# Patient Record
Sex: Female | Born: 1985 | Hispanic: No | Marital: Single | Smoking: Never smoker
Health system: Southern US, Community
[De-identification: ages and names within clinical notes are randomized; demographics above are authoritative.]

## PROBLEM LIST (undated history)

## (undated) ENCOUNTER — Ambulatory Visit (HOSPITAL_COMMUNITY): Discharge status: Absent without leave | Payer: Medicaid Other

---
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
## 2005-01-23 ENCOUNTER — Encounter: Admission: RE | Admit: 2005-01-23 | Discharge: 2005-01-23 | Payer: Self-pay | Admitting: Cardiology

## 2005-02-07 ENCOUNTER — Ambulatory Visit (HOSPITAL_COMMUNITY): Admission: RE | Admit: 2005-02-07 | Discharge: 2005-02-07 | Payer: Self-pay | Admitting: Cardiology

## 2005-05-28 ENCOUNTER — Other Ambulatory Visit: Admission: RE | Admit: 2005-05-28 | Discharge: 2005-05-28 | Payer: Self-pay | Admitting: Obstetrics and Gynecology

## 2005-06-06 ENCOUNTER — Emergency Department (HOSPITAL_COMMUNITY): Admission: EM | Admit: 2005-06-06 | Discharge: 2005-06-06 | Payer: Self-pay | Admitting: Emergency Medicine

## 2005-06-08 ENCOUNTER — Emergency Department (HOSPITAL_COMMUNITY): Admission: EM | Admit: 2005-06-08 | Discharge: 2005-06-08 | Payer: Self-pay | Admitting: Emergency Medicine

## 2005-07-25 ENCOUNTER — Emergency Department (HOSPITAL_COMMUNITY): Admission: EM | Admit: 2005-07-25 | Discharge: 2005-07-25 | Payer: Self-pay | Admitting: Family Medicine

## 2005-10-10 ENCOUNTER — Emergency Department (HOSPITAL_COMMUNITY): Admission: EM | Admit: 2005-10-10 | Discharge: 2005-10-10 | Payer: Self-pay | Admitting: Family Medicine

## 2006-03-04 ENCOUNTER — Other Ambulatory Visit: Admission: RE | Admit: 2006-03-04 | Discharge: 2006-03-04 | Payer: Self-pay | Admitting: Obstetrics and Gynecology

## 2006-03-19 ENCOUNTER — Emergency Department (HOSPITAL_COMMUNITY): Admission: EM | Admit: 2006-03-19 | Discharge: 2006-03-19 | Payer: Self-pay | Admitting: Family Medicine

## 2006-10-10 ENCOUNTER — Emergency Department (HOSPITAL_COMMUNITY): Admission: EM | Admit: 2006-10-10 | Discharge: 2006-10-10 | Payer: Self-pay | Admitting: Emergency Medicine

## 2007-05-06 ENCOUNTER — Emergency Department (HOSPITAL_COMMUNITY): Admission: EM | Admit: 2007-05-06 | Discharge: 2007-05-06 | Payer: Self-pay | Admitting: Emergency Medicine

## 2007-08-03 ENCOUNTER — Emergency Department (HOSPITAL_COMMUNITY): Admission: EM | Admit: 2007-08-03 | Discharge: 2007-08-03 | Payer: Self-pay | Admitting: Emergency Medicine

## 2008-01-16 ENCOUNTER — Emergency Department (HOSPITAL_COMMUNITY): Admission: EM | Admit: 2008-01-16 | Discharge: 2008-01-16 | Payer: Self-pay | Admitting: Emergency Medicine

## 2008-10-22 ENCOUNTER — Emergency Department (HOSPITAL_COMMUNITY): Admission: EM | Admit: 2008-10-22 | Discharge: 2008-10-22 | Payer: Self-pay | Admitting: Emergency Medicine

## 2008-11-06 ENCOUNTER — Emergency Department (HOSPITAL_COMMUNITY): Admission: EM | Admit: 2008-11-06 | Discharge: 2008-11-06 | Payer: Self-pay | Admitting: Family Medicine

## 2009-03-01 ENCOUNTER — Inpatient Hospital Stay (HOSPITAL_COMMUNITY): Admission: AD | Admit: 2009-03-01 | Discharge: 2009-03-01 | Payer: Self-pay | Admitting: Obstetrics & Gynecology

## 2009-03-01 ENCOUNTER — Ambulatory Visit: Payer: Self-pay | Admitting: Obstetrics and Gynecology

## 2009-03-10 ENCOUNTER — Inpatient Hospital Stay (HOSPITAL_COMMUNITY): Admission: RE | Admit: 2009-03-10 | Discharge: 2009-03-10 | Payer: Self-pay | Admitting: Obstetrics & Gynecology

## 2010-01-02 ENCOUNTER — Emergency Department (HOSPITAL_COMMUNITY): Admission: EM | Admit: 2010-01-02 | Discharge: 2010-01-02 | Payer: Self-pay | Admitting: Family Medicine

## 2010-01-13 ENCOUNTER — Inpatient Hospital Stay (HOSPITAL_COMMUNITY): Admission: AD | Admit: 2010-01-13 | Discharge: 2010-01-13 | Payer: Self-pay | Admitting: Obstetrics & Gynecology

## 2010-03-30 ENCOUNTER — Emergency Department (HOSPITAL_COMMUNITY): Admission: EM | Admit: 2010-03-30 | Discharge: 2010-03-30 | Payer: Self-pay | Admitting: Family Medicine

## 2010-04-09 ENCOUNTER — Inpatient Hospital Stay (HOSPITAL_COMMUNITY): Admission: AD | Admit: 2010-04-09 | Discharge: 2010-04-09 | Payer: Self-pay | Admitting: Obstetrics and Gynecology

## 2010-04-09 ENCOUNTER — Ambulatory Visit: Payer: Self-pay | Admitting: Advanced Practice Midwife

## 2010-09-13 ENCOUNTER — Inpatient Hospital Stay (HOSPITAL_COMMUNITY)
Admission: AD | Admit: 2010-09-13 | Discharge: 2010-09-15 | Payer: Self-pay | Source: Home / Self Care | Attending: Obstetrics and Gynecology | Admitting: Obstetrics and Gynecology

## 2010-12-10 LAB — CBC
HCT: 33.3 % — ABNORMAL LOW (ref 36.0–46.0)
MCHC: 34.5 g/dL (ref 30.0–36.0)
MCV: 87.3 fL (ref 78.0–100.0)
Platelets: 254 10*3/uL (ref 150–400)
Platelets: 284 10*3/uL (ref 150–400)
RDW: 14 % (ref 11.5–15.5)
RDW: 14.1 % (ref 11.5–15.5)
WBC: 10.5 10*3/uL (ref 4.0–10.5)
WBC: 17.2 10*3/uL — ABNORMAL HIGH (ref 4.0–10.5)

## 2010-12-16 LAB — POCT RAPID STREP A (OFFICE): Streptococcus, Group A Screen (Direct): NEGATIVE

## 2010-12-18 LAB — URINALYSIS, ROUTINE W REFLEX MICROSCOPIC
Bilirubin Urine: NEGATIVE
Nitrite: NEGATIVE
Protein, ur: NEGATIVE mg/dL
Urobilinogen, UA: 0.2 mg/dL (ref 0.0–1.0)

## 2010-12-18 LAB — CBC
HCT: 37.4 % (ref 36.0–46.0)
MCV: 92.8 fL (ref 78.0–100.0)
RBC: 4.03 MIL/uL (ref 3.87–5.11)
WBC: 9 10*3/uL (ref 4.0–10.5)

## 2010-12-18 LAB — GC/CHLAMYDIA PROBE AMP, GENITAL: Chlamydia, DNA Probe: NEGATIVE

## 2010-12-18 LAB — WET PREP, GENITAL: Yeast Wet Prep HPF POC: NONE SEEN

## 2010-12-18 LAB — URINE CULTURE

## 2010-12-18 LAB — URINE MICROSCOPIC-ADD ON

## 2010-12-19 LAB — POCT URINALYSIS DIP (DEVICE)
Bilirubin Urine: NEGATIVE
Glucose, UA: NEGATIVE mg/dL
Ketones, ur: 40 mg/dL — AB
Specific Gravity, Urine: 1.025 (ref 1.005–1.030)
Urobilinogen, UA: 0.2 mg/dL (ref 0.0–1.0)

## 2010-12-19 LAB — POCT PREGNANCY, URINE: Preg Test, Ur: POSITIVE

## 2011-01-07 LAB — URINALYSIS, ROUTINE W REFLEX MICROSCOPIC
Ketones, ur: 15 mg/dL — AB
Leukocytes, UA: NEGATIVE
Nitrite: NEGATIVE
Specific Gravity, Urine: >1.03 — ABNORMAL HIGH (ref 1.005–1.030)
pH: 5.5 (ref 5.0–8.0)

## 2011-01-07 LAB — GC/CHLAMYDIA PROBE AMP, GENITAL: GC Probe Amp, Genital: NEGATIVE

## 2011-01-07 LAB — WET PREP, GENITAL: Clue Cells Wet Prep HPF POC: NONE SEEN

## 2011-01-14 LAB — POCT URINALYSIS DIP (DEVICE)
Bilirubin Urine: NEGATIVE
Ketones, ur: NEGATIVE mg/dL
Nitrite: NEGATIVE
Specific Gravity, Urine: >=1.03 (ref 1.005–1.030)
pH: 6.5 (ref 5.0–8.0)

## 2011-01-14 LAB — WET PREP, GENITAL: Yeast Wet Prep HPF POC: NONE SEEN

## 2011-01-14 LAB — POCT PREGNANCY, URINE: Preg Test, Ur: NEGATIVE

## 2011-01-15 LAB — POCT PREGNANCY, URINE: Preg Test, Ur: NEGATIVE

## 2011-01-15 LAB — WET PREP, GENITAL: WBC, Wet Prep HPF POC: NONE SEEN

## 2011-01-18 IMAGING — US US OB COMP LESS 14 WK
1 series · 14 of 28 positions shown · non-contrast
Comparison: None

CLINICAL DATA: Early pregnancy.  Pain.

OBSTETRIC <14 WK ULTRASOUND
TECHNIQUE: Transabdominal ultrasound was performed for evaluation
of the gestation as well as the maternal uterus and adnexal
regions.

[Series 1: us ob comp less 14 wks · 0.18mm/px · 14 of 52 slices shown]
[im 2/52]
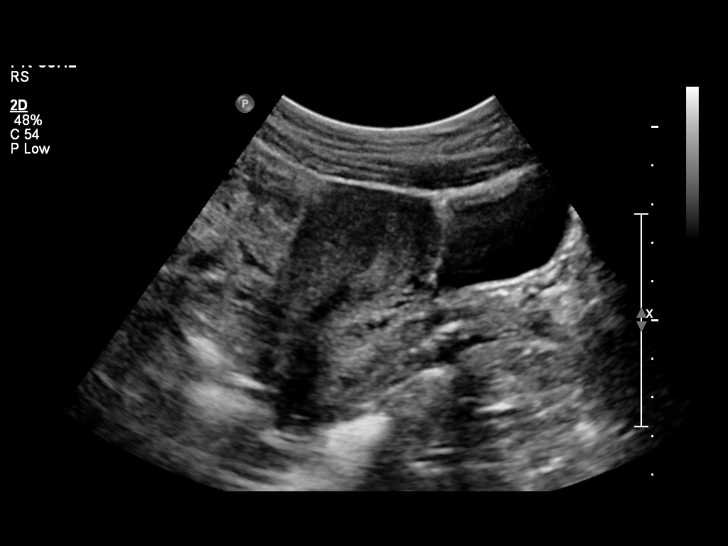
[im 6/52]
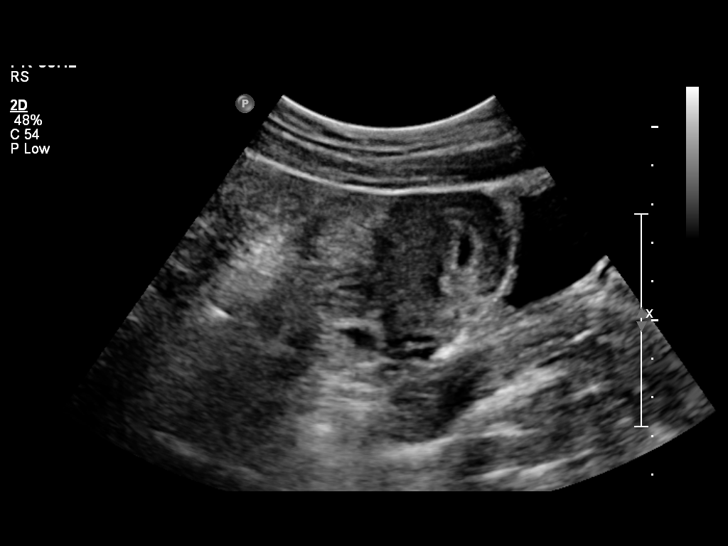
[im 10/52]
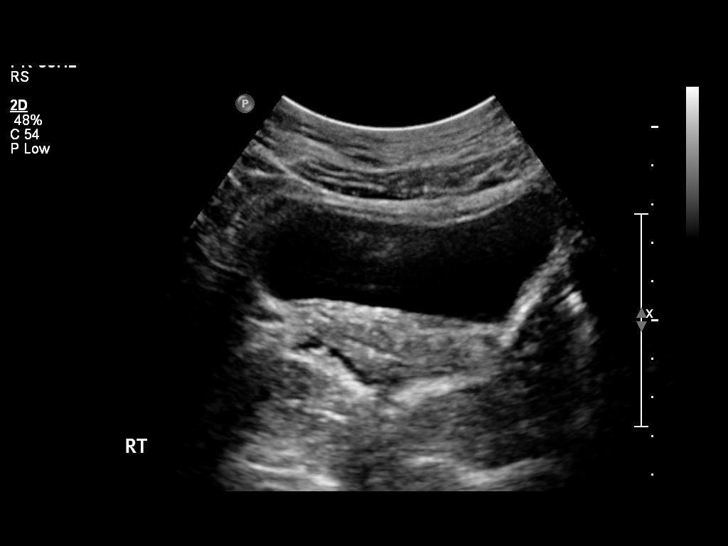
[im 14/52]
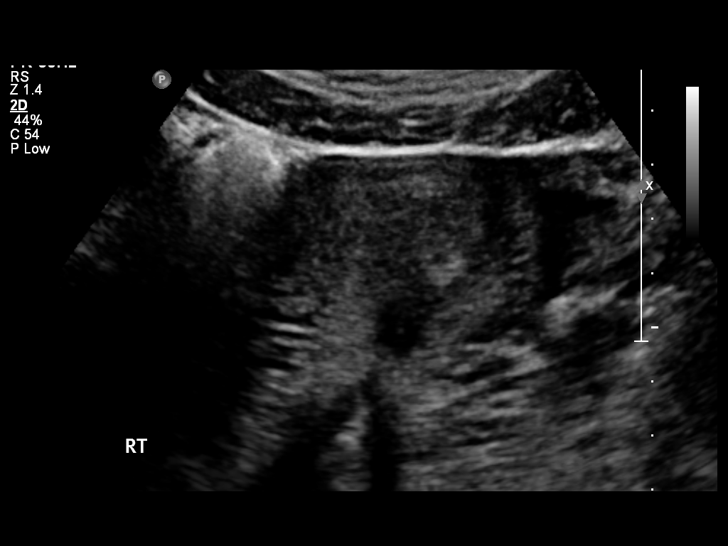
[im 18/52]
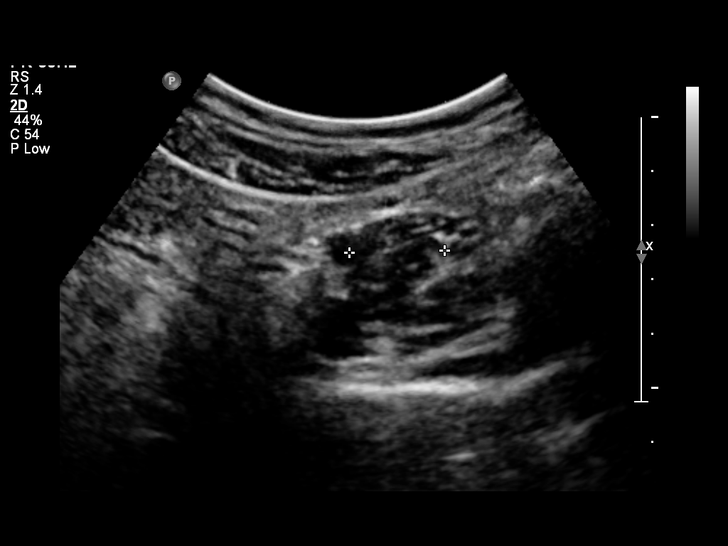
[im 21/52]
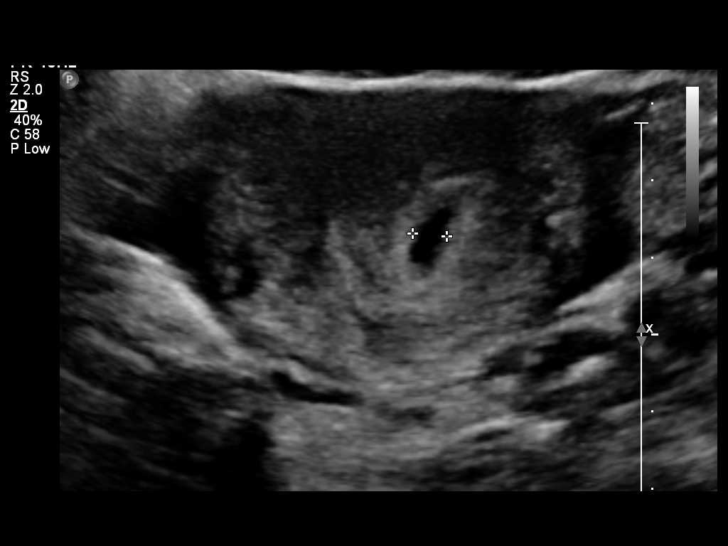
[im 25/52]
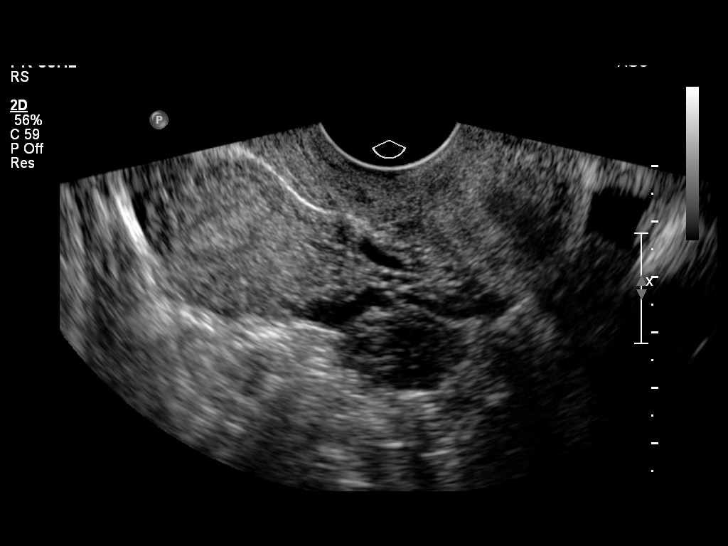
[im 29/52]
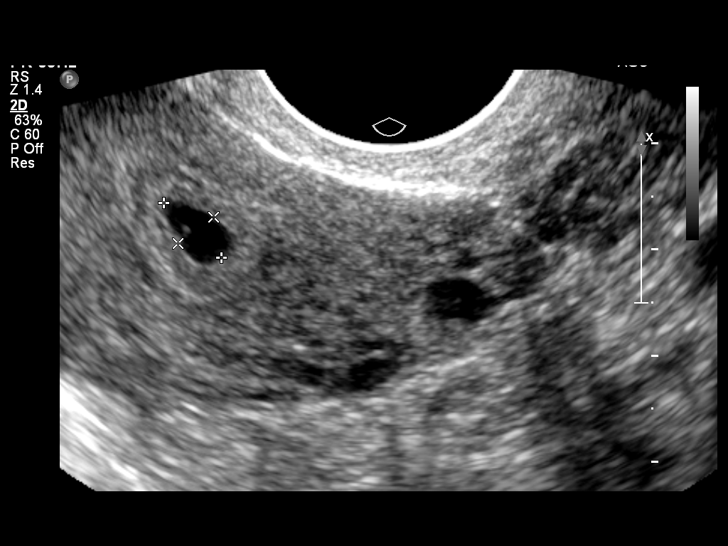
[im 33/52]
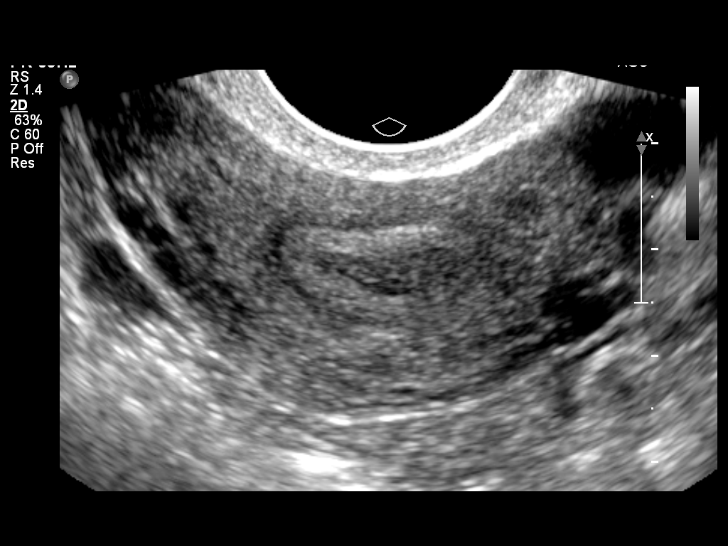
[im 36/52]
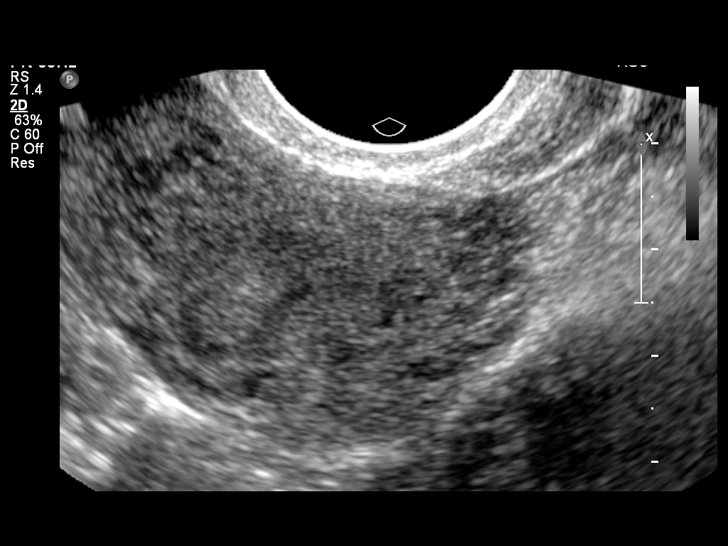
[im 40/52]
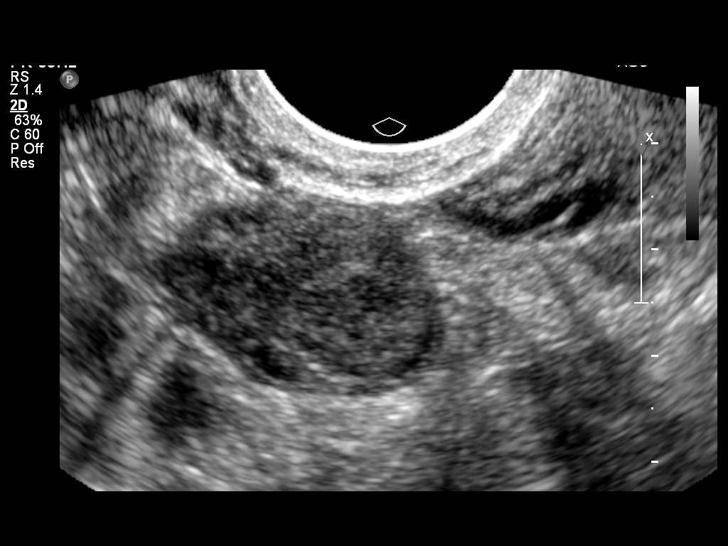
[im 44/52]
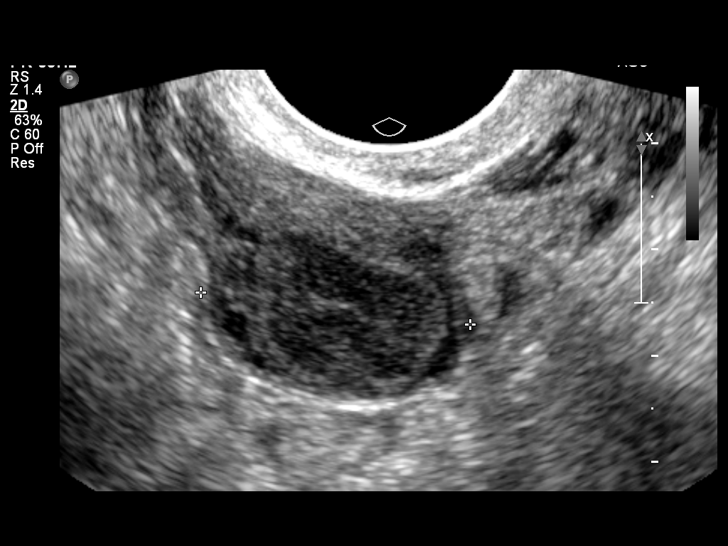
[im 48/52]
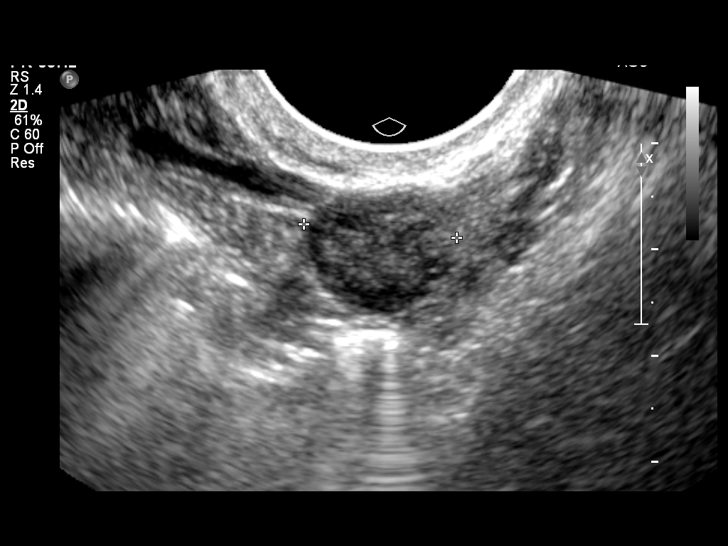
[im 52/52]
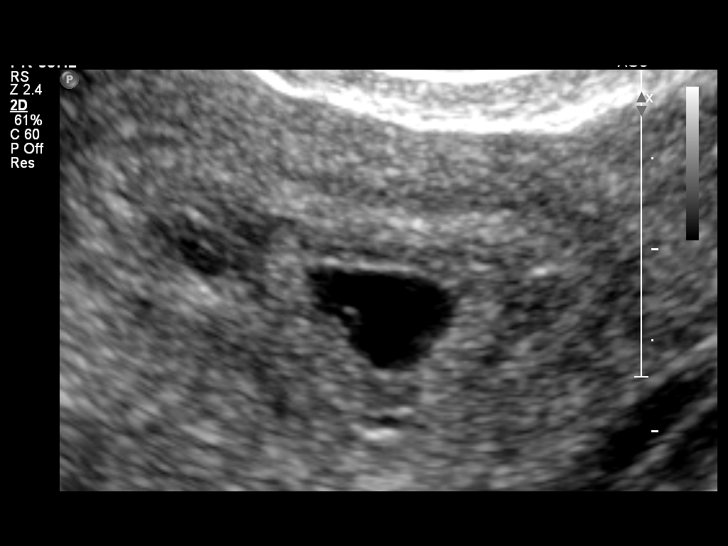

[14 of 28 positions shown; findings below may reference images not displayed]

Intrauterine gestational sac: Single, normal in appearance.
Yolk sac: Visible.  Normal.
Embryo: No fetal pole evident at this time.
MSD: 65 mm  5w  2d

Maternal uterus/Adnexae:
Normal-appearing right ovary measuring 3.2 x 2.2 x 2.6 cm.  Normal
appearing left ovary measuring 2.3 x 1.0 x 1.5 cm.

Question of a small subchorionic hemorrhage.  This is not definite.
IMPRESSION: Early intrauterine pregnancy with a gestational sac and yolk sac
but no visible fetal pole.  5 weeks 2-day pregnancy by mean sac
diameter.  Question of a tiny subchorionic hemorrhage.

## 2011-07-09 LAB — WET PREP, GENITAL
Clue Cells Wet Prep HPF POC: NONE SEEN
WBC, Wet Prep HPF POC: NONE SEEN

## 2011-07-09 LAB — GC/CHLAMYDIA PROBE AMP, GENITAL: Chlamydia, DNA Probe: NEGATIVE

## 2012-03-14 ENCOUNTER — Encounter (HOSPITAL_COMMUNITY): Payer: Self-pay

## 2012-03-14 ENCOUNTER — Emergency Department (HOSPITAL_COMMUNITY)
Admission: EM | Admit: 2012-03-14 | Discharge: 2012-03-14 | Disposition: A | Discharge status: Home / Self Care | Payer: Self-pay | Attending: Emergency Medicine | Admitting: Emergency Medicine

## 2012-03-14 DIAGNOSIS — J029 Acute pharyngitis, unspecified: Secondary | ICD-10-CM

## 2012-03-14 DIAGNOSIS — J020 Streptococcal pharyngitis: Secondary | ICD-10-CM | POA: Insufficient documentation

## 2012-03-14 MED ORDER — IBUPROFEN 200 MG PO TABS
600.0000 mg | ORAL_TABLET | Freq: Once | ORAL | Status: AC
  Administered 2012-03-14: 600 mg via ORAL
  Filled 2012-03-14: qty 1

## 2012-03-14 MED ORDER — AMOXICILLIN 500 MG PO CAPS: 500.0000 mg | ORAL_CAPSULE | Freq: Three times a day (TID) | ORAL | Status: AC

## 2012-03-14 NOTE — Discharge Instructions (Signed)
Rest. Drink plenty of fluids. Take motrin or aleve as need. Use throat lozenges as need. Take amoxicillin as prescribed. Follow up with primary care doctor in 4-5 days if symptoms fail to improve/resolve.  Return to ER if worse, trouble breathing, unable to swallow, severe one sided throat pain/swelling, other concern.       Sore Throat Sore throats may be caused by bacteria and viruses. They may also be caused by:  Smoking.   Pollution.   Allergies.  If a sore throat is due to strep infection (a bacterial infection), you may need:  A throat swab.   A culture test to verify the strep infection.  You will need one of these:  An antibiotic shot.   Oral medicine for a full 10 days.  Strep infection is very contagious. A doctor should check any close contacts who have a sore throat or fever. A sore throat caused by a virus infection will usually last only 3-4 days. Antibiotics will not treat a viral sore throat.  Infectious mononucleosis (a viral disease), however, can cause a sore throat that lasts for up to 3 weeks. Mononucleosis can be diagnosed with blood tests. You must have been sick for at least 1 week in order for the test to give accurate results. HOME CARE INSTRUCTIONS   To treat a sore throat, take mild pain medicine.   Increase your fluids.   Eat a soft diet.   Do not smoke.   Gargling with warm water or salt water (1 tsp. salt in 8 oz. water) can be helpful.   Try throat sprays or lozenges or sucking on hard candy to ease the symptoms.  Call your doctor if your sore throat lasts longer than 1 week.  SEEK IMMEDIATE MEDICAL CARE IF:  You have difficulty breathing.   You have increased swelling in the throat.   You have pain so severe that you are unable to swallow fluids or your saliva.   You have a severe headache, a high fever, vomiting, or a red rash.  Document Released: 10/24/2004 Document Revised: 09/05/2011 Document Reviewed: 09/03/2007 [facility]  Patient Information 2012 [place], [place].      Pharyngitis, Viral and Bacterial Pharyngitis is soreness (inflammation) or infection of the pharynx. It is also called a sore throat. CAUSES  Most sore throats are caused by viruses and are part of a cold. However, some sore throats are caused by strep and other bacteria. Sore throats can also be caused by post nasal drip from draining sinuses, allergies and sometimes from sleeping with an open mouth. Infectious sore throats can be spread from person to person by coughing, sneezing and sharing cups or eating utensils. TREATMENT  Sore throats that are viral usually last 3-4 days. Viral illness will get better without medications (antibiotics). Strep throat and other bacterial infections will usually begin to get better about 24-48 hours after you begin to take antibiotics. HOME CARE INSTRUCTIONS   If the caregiver feels there is a bacterial infection or if there is a positive strep test, they will prescribe an antibiotic. The full course of antibiotics must be taken. If the full course of antibiotic is not taken, you or your child may become ill again. If you or your child has strep throat and do not finish all of the medication, serious heart or kidney diseases may develop.   Drink enough water and fluids to keep your urine clear or pale yellow.   Only take over-the-counter or prescription medicines for pain, discomfort  or fever as directed by your caregiver.   Get lots of rest.   Gargle with salt water ( tsp. of salt in a glass of water) as often as every 1-2 hours as you need for comfort.   Hard candies may soothe the throat if individual is not at risk for choking. Throat sprays or lozenges may also be used.  SEEK MEDICAL CARE IF:   Large, tender lumps in the neck develop.   A rash develops.   Green, yellow-brown or bloody sputum is coughed up.   Your baby is older than 3 months with a rectal temperature of 100.5 F (38.1 C) or  higher for more than 1 day.  SEEK IMMEDIATE MEDICAL CARE IF:   A stiff neck develops.   You or your child are drooling or unable to swallow liquids.   You or your child are vomiting, unable to keep medications or liquids down.   You or your child has severe pain, unrelieved with recommended medications.   You or your child are having difficulty breathing (not due to stuffy nose).   You or your child are unable to fully open your mouth.   You or your child develop redness, swelling, or severe pain anywhere on the neck.   You have a fever.   Your baby is older than 3 months with a rectal temperature of 102 F (38.9 C) or higher.   Your baby is 38 months old or younger with a rectal temperature of 100.4 F (38 C) or higher.  MAKE SURE YOU:   Understand these instructions.   Will watch your condition.   Will get help right away if you are not doing well or get worse.  Document Released: 09/16/2005 Document Revised: 09/05/2011 Document Reviewed: 12/14/2007 [facility] Patient Information 2012 [place], [place].

## 2012-03-14 NOTE — ED Notes (Signed)
Pt in from home with c/o sore throat and bilateral ear pain states  Ongoing x3 days states no relief with medication

## 2012-03-14 NOTE — ED Provider Notes (Signed)
History     CSN: [id number]  Arrival date & time 03/14/12  1115   First MD Initiated Contact with Patient 03/14/12 1138      Chief Complaint  Patient presents with  . Sore Throat  . Otalgia  . Fever    (Consider location/radiation/quality/duration/timing/severity/associated sxs/prior treatment) Patient is a 26 y.o. female presenting with pharyngitis, ear pain, and fever. The history is provided by the patient.  Sore Throat  Otalgia Associated symptoms include sore throat. Pertinent negatives include no diarrhea, no vomiting, no cough and no rash.  Fever Primary symptoms of the febrile illness include fever. Primary symptoms do not include cough, vomiting, diarrhea, dysuria or rash.  pt with sore throat and fever for past 2 days. Constant,dull. Bilateral throat, no unilateral swelling or pain. No trouble breathing or swallowing. Fevers. No chills/sweats. Dull frontal headache on and off. No constant or severe head pain.  Headache gradual onset, c/w prior headaches. No neck stiffness or pain. No sinus congestion/pain. No cough. No known ill contacts. No known mono or strep exposure. No nvd. No abd pain.   History reviewed. No pertinent past medical history.  History reviewed. No pertinent past surgical history.  No family history on file.  History  Substance Use Topics  . Smoking status: Never Smoker   . Smokeless tobacco: Not on file  . Alcohol Use: Yes    OB History    Grav Para Term Preterm Abortions TAB SAB Ect Mult Living                  Review of Systems  Constitutional: Positive for fever.  HENT: Positive for sore throat.   Respiratory: Negative for cough.   Gastrointestinal: Negative for vomiting and diarrhea.  Genitourinary: Negative for dysuria.  Skin: Negative for rash.    Allergies  Review of patient's allergies indicates no known allergies.  Home Medications  No current outpatient prescriptions on file.  BP 114/59  Pulse 109  Temp 102.3 F  (39.1 C) (Oral)  Resp 20  SpO2 100%  LMP 03/14/2012  Physical Exam  Nursing note and vitals reviewed. Constitutional: She is oriented to person, place, and time. She appears well-developed and well-nourished. No distress.       Febrile.   HENT:  Left Ear: External ear normal.  Nose: Nose normal.       Small amt cerumen right ear. Pharynx erythematous w symmetric tonsillar swelling. +exudate. No asymmetric swelling or abscess. No trismus. No swelling/pain/tenderness to neck or floor of mouth.   Eyes: Conjunctivae are normal. Right eye exhibits no discharge. Left eye exhibits no discharge. No scleral icterus.  Neck: Neck supple. No tracheal deviation present.       No stiffness or rigidity, supple. +ant cerv l/a, no post cervical l/a.   Cardiovascular: Normal rate, regular rhythm, normal heart sounds and intact distal pulses.  Exam reveals no gallop and no friction rub.   No murmur heard. Pulmonary/Chest: Effort normal and breath sounds normal. No respiratory distress.  Abdominal: Soft. Normal appearance. She exhibits no distension. There is no tenderness.       No hsm.   Genitourinary:       No cva tenderness  Musculoskeletal: She exhibits no edema.  Neurological: She is alert and oriented to person, place, and time.       Steady gait.   Skin: Skin is warm and dry. No rash noted.  Psychiatric: She has a normal mood and affect.    ED Course  Procedures (including critical care time)     MDM  Hx/exam felt c/w strep throat. Confirmed nkda w pt. Motrin po.  rx amox.         Suzi Roots, MD 03/14/12 1147

## 2012-03-14 NOTE — ED Notes (Signed)
Pt verbalizes understanding 

## 2017-05-12 ENCOUNTER — Encounter (HOSPITAL_COMMUNITY): Payer: Self-pay | Admitting: *Deleted

## 2017-05-12 ENCOUNTER — Ambulatory Visit (HOSPITAL_COMMUNITY)
Admission: EM | Admit: 2017-05-12 | Discharge: 2017-05-12 | Disposition: A | Discharge status: Home / Self Care | Payer: Medicaid Other | Attending: Family Medicine | Admitting: Family Medicine

## 2017-05-12 DIAGNOSIS — Z113 Encounter for screening for infections with a predominantly sexual mode of transmission: Secondary | ICD-10-CM | POA: Diagnosis not present

## 2017-05-12 DIAGNOSIS — Z202 Contact with and (suspected) exposure to infections with a predominantly sexual mode of transmission: Secondary | ICD-10-CM | POA: Insufficient documentation

## 2017-05-12 DIAGNOSIS — N898 Other specified noninflammatory disorders of vagina: Secondary | ICD-10-CM | POA: Diagnosis present

## 2017-05-12 DIAGNOSIS — A749 Chlamydial infection, unspecified: Secondary | ICD-10-CM | POA: Diagnosis not present

## 2017-05-12 LAB — POCT URINALYSIS DIP (DEVICE)
Bilirubin Urine: NEGATIVE
GLUCOSE, UA: NEGATIVE mg/dL
Nitrite: NEGATIVE
PROTEIN: NEGATIVE mg/dL
Specific Gravity, Urine: 1.015 (ref 1.005–1.030)
UROBILINOGEN UA: 0.2 mg/dL (ref 0.0–1.0)
pH: 7.5 (ref 5.0–8.0)

## 2017-05-12 MED ORDER — AZITHROMYCIN 250 MG PO TABS
1000.0000 mg | ORAL_TABLET | Freq: Once | ORAL | Status: AC
  Administered 2017-05-12: 1000 mg via ORAL

## 2017-05-12 MED ORDER — AZITHROMYCIN 250 MG PO TABS
ORAL_TABLET | ORAL | Status: AC
  Filled 2017-05-12: qty 4

## 2017-05-12 NOTE — ED Triage Notes (Signed)
Patient states she [facility] to be checked for STDs. Patient was called and was told she is positive for chlamydia. They however did not test for any other STD. Patient reports vaginal discharge.

## 2017-05-12 NOTE — Discharge Instructions (Signed)
You were treated for Chlamydia. Cytology, HIV, syphilis sent for testing, you will be contacted with any positive results that requires further treatment. Urine is suspicious for bacterial infection, cultures sent for testing, you will be contacted with any positive results that requires further treatment. Monitor for worsening of symptoms, abdominal pain, fever, nausea, vomiting, follow-up for reevaluation.

## 2017-05-12 NOTE — ED Provider Notes (Signed)
[facility]    CSN: [id number] Arrival date & time: 05/12/17  1827     History   Chief Complaint Chief Complaint  Patient presents with  . Vaginal Discharge  . SEXUALLY TRANSMITTED DISEASE    HPI Connie Hale is a 31 y.o. female.   31 year old female comes in for STDs. She states she has had vaginal discharge, and was tested at the health department with positive chlamydia. States they only tested for gonorrhea and chlamydia, and would like to have all STDs tested, including HIV/syphilis. She denies vaginal pain/itchiness, spotting, genital ulcers. She is currently sexually active with one partner, no condom use. LMP 04/29/2017. Denies abdominal pain, nausea, vomiting. Does admit to suprapubic pressure, with some frequency, denies dysuria, hematuria. She does endorse more fluid intake, and states symptoms are not consistent with past UTI symptoms. Denies fever, chills, night sweats, back pain.       History reviewed. No pertinent past medical history.  There are no active problems to display for this patient.   History reviewed. No pertinent surgical history.  OB History    No data available       Home Medications    Prior to Admission medications   Not on File    Family History History reviewed. No pertinent family history.  Social History Social History  Substance Use Topics  . Smoking status: Never Smoker  . Smokeless tobacco: Never Used  . Alcohol use Yes     Allergies   Patient has no known allergies.   Review of Systems Review of Systems  Reason unable to perform ROS: See HPI as above.     Physical Exam Triage Vital Signs ED Triage Vitals [05/12/17 1852]  Enc Vitals Group     BP 117/76     Pulse Rate 76     Resp 16     Temp 99 F (37.2 C)     Temp Source Oral     SpO2 98 %     Weight      Height      Head Circumference      Peak Flow      Pain Score      Pain Loc      Pain Edu?      Excl. in GC?    No data  found.   Updated Vital Signs BP 117/76 (BP Location: Left Arm)   Pulse 76   Temp 99 F (37.2 C) (Oral)   Resp 16   SpO2 98%    Physical Exam  Constitutional: She is oriented to person, place, and time. She appears well-developed and well-nourished. No distress.  HENT:  Head: Normocephalic and atraumatic.  Eyes: Pupils are equal, round, and reactive to light. Conjunctivae are normal.  Cardiovascular: Normal rate, regular rhythm and normal heart sounds.  Exam reveals no gallop and no friction rub.   No murmur heard. Pulmonary/Chest: Effort normal and breath sounds normal. She has no wheezes. She has no rales.  Abdominal: Soft. Bowel sounds are normal. She exhibits no mass. There is no tenderness. There is no rebound, no guarding and no CVA tenderness.  Genitourinary:  Genitourinary Comments: Deferred due to patient preference. Cytology obtained by patient.   Neurological: She is alert and oriented to person, place, and time.  Skin: Skin is warm and dry.  Psychiatric: She has a normal mood and affect. Her behavior is normal. Judgment normal.     UC Treatments / Results  Labs (  all labs ordered are listed, but only abnormal results are displayed) Labs Reviewed  POCT URINALYSIS DIP (DEVICE) - Abnormal; Notable for the following:       Result Value   Ketones, ur TRACE (*)    Hgb urine dipstick MODERATE (*)    Leukocytes, UA SMALL (*)    All other components within normal limits  URINE CULTURE  HIV ANTIBODY (ROUTINE TESTING)  RPR  CERVICOVAGINAL ANCILLARY ONLY    EKG  EKG Interpretation None       Radiology No results found.  Procedures Procedures (including critical care time)  Medications Ordered in UC Medications  azithromycin (ZITHROMAX) tablet 1,000 mg (1,000 mg Oral Given 05/12/17 2025)     Initial Impression / Assessment and Plan / UC Course  I have reviewed the triage vital signs and the nursing notes.  Pertinent labs & imaging results that were  available during my care of the patient were reviewed by me and considered in my medical decision making (see chart for details).    Patient treated for chlamydia. Cytology, HIV, syphilis sent for testing. Urine dipstick showed small leukocytes and moderate hgb, but given patient without significant urinary symptoms, will wait for urine culture for treatment. Patient will be contacted with any positive results and any additional treatment needed will be called in. Return precautions given.   Final Clinical Impressions(s) / UC Diagnoses   Final diagnoses:  Chlamydia  Exposure to sexually transmitted disease (STD)    New Prescriptions There are no discharge medications for this patient.    Belinda Fisher, PA-C 05/12/17 2311

## 2017-05-13 LAB — SYPHILIS: RPR W/REFLEX TO RPR TITER AND TREPONEMAL ANTIBODIES, TRADITIONAL SCREENING AND DIAGNOSIS ALGORITHM: RPR Ser Ql: NONREACTIVE

## 2017-05-13 LAB — HIV ANTIBODY (ROUTINE TESTING W REFLEX): HIV Screen 4th Generation wRfx: NONREACTIVE

## 2017-05-14 LAB — URINE CULTURE: CULTURE: NO GROWTH

## 2017-05-14 LAB — CERVICOVAGINAL ANCILLARY ONLY
Bacterial vaginitis: POSITIVE — AB
Candida vaginitis: NEGATIVE
Chlamydia: POSITIVE — AB
Neisseria Gonorrhea: NEGATIVE
TRICH (WINDOWPATH): NEGATIVE

## 2017-05-15 ENCOUNTER — Telehealth (HOSPITAL_COMMUNITY): Payer: Self-pay | Admitting: Emergency Medicine

## 2017-05-15 MED ORDER — METRONIDAZOLE 500 MG PO TABS: 500.0000 mg | ORAL_TABLET | Freq: Two times a day (BID) | ORAL | 0 refills | Status: DC

## 2017-05-15 NOTE — Telephone Encounter (Signed)
Called pt to notify of recent lab results.... Pt ID'd properly Reports feeling better and sx are subsiding but would like for us to call in Flagyl to [facility] on [address]. Adv pt if sx are not getting better to return or to f/u w/PCP Education on safe sex given Also adv pt to notify partner(s) [facility] Notified pt that lab results can be obtained through MyChart Pt verb understanding.

## 2017-05-15 NOTE — Telephone Encounter (Signed)
-----   Message from Belinda FisherAmy Yu V, [place]-C sent at 05/14/2017 10:04 AM EDT ----- Cytology with positive BV and chlamydia. Patient came in for treatment of chlamydia, azithromycin was given in office. Clinical staff, please call in Flagyl 500mg  BID PO x 7 days D#14 R#0. Syphilis and HIV screening non reactive. [facility]

## 2017-06-24 ENCOUNTER — Emergency Department (HOSPITAL_COMMUNITY): Payer: Medicaid Other

## 2017-06-24 ENCOUNTER — Emergency Department (HOSPITAL_COMMUNITY)
Admission: EM | Admit: 2017-06-24 | Discharge: 2017-06-24 | Disposition: A | Discharge status: Home / Self Care | Payer: Medicaid Other | Attending: Emergency Medicine | Admitting: Emergency Medicine

## 2017-06-24 ENCOUNTER — Encounter (HOSPITAL_COMMUNITY): Payer: Self-pay | Admitting: Emergency Medicine

## 2017-06-24 DIAGNOSIS — N3001 Acute cystitis with hematuria: Secondary | ICD-10-CM | POA: Insufficient documentation

## 2017-06-24 DIAGNOSIS — R1031 Right lower quadrant pain: Secondary | ICD-10-CM | POA: Diagnosis not present

## 2017-06-24 DIAGNOSIS — R109 Unspecified abdominal pain: Secondary | ICD-10-CM

## 2017-06-24 LAB — URINALYSIS, ROUTINE W REFLEX MICROSCOPIC
BILIRUBIN URINE: NEGATIVE
Glucose, UA: NEGATIVE mg/dL
KETONES UR: NEGATIVE mg/dL
Nitrite: NEGATIVE
Protein, ur: 30 mg/dL — AB
Specific Gravity, Urine: 1.01 (ref 1.005–1.030)
pH: 6 (ref 5.0–8.0)

## 2017-06-24 LAB — POC URINE PREG, ED: PREG TEST UR: NEGATIVE

## 2017-06-24 MED ORDER — SODIUM CHLORIDE 0.9 % IV BOLUS (SEPSIS)
1000.0000 mL | Freq: Once | INTRAVENOUS | Status: AC
  Administered 2017-06-24: 1000 mL via INTRAVENOUS

## 2017-06-24 MED ORDER — TRAMADOL HCL 50 MG PO TABS: 50.0000 mg | ORAL_TABLET | Freq: Four times a day (QID) | ORAL | 0 refills | Status: AC | PRN

## 2017-06-24 MED ORDER — KETOROLAC TROMETHAMINE 30 MG/ML IJ SOLN
15.0000 mg | Freq: Once | INTRAMUSCULAR | Status: AC
  Administered 2017-06-24: 15 mg via INTRAVENOUS
  Filled 2017-06-24: qty 1

## 2017-06-24 MED ORDER — FOSFOMYCIN TROMETHAMINE 3 G PO PACK
3.0000 g | PACK | Freq: Once | ORAL | Status: AC
  Administered 2017-06-24: 3 g via ORAL
  Filled 2017-06-24: qty 3

## 2017-06-24 NOTE — ED Triage Notes (Signed)
Patient right flank pain that has been intermittent over past couple days but has been constant since 3am today. Patient reports that she drinks lots of Mt Dew and tried drinking cranberry juice to pass flank pain.  Patient reports full feeling in bladder last night but denies dysuria. Patient reports did have blood in urine on Sunday but none since.

## 2017-06-24 NOTE — ED Provider Notes (Addendum)
[facility] Note   CSN: [id number] Arrival date & time: 06/24/17  0830     History   Chief Complaint Chief Complaint  Patient presents with  . Flank Pain    HPI Connie Hale is a 31 y.o. female.  HPI  She presents with new flank pain. Pain began maybe one week ago, since onset was waxing, waning, intermittent. However, over the past 9 hours or so the pain has been persistent, sharp, sore, severe or Patient is focally in the right flank. No relief with OTC medication or juice. No fever, no chills, no dysuria, one episode of bleeding earlier in the week.   History reviewed. No pertinent past medical history.  There are no active problems to display for this patient.   History reviewed. No pertinent surgical history.  OB History    No data available       Home Medications    Prior to Admission medications   Medication Sig Start Date End Date Taking? Authorizing Provider  Norgestim-Eth Estrad Triphasic (ORTHO TRI-CYCLEN, 28, PO) Take 1 tablet by mouth daily.   Yes [provider]  Jeanann Lewandowsky (PREPARATION H EX) Apply 1 application topically daily as needed (pain).   Yes [provider]  metroNIDAZOLE (FLAGYL) 500 MG tablet Take 1 tablet (500 mg total) by mouth 2 (two) times daily. Patient not taking: Reported on 06/24/2017 05/15/17   Eustace Moore, MD    Family History No family history on file.  Social History Social History  Substance Use Topics  . Smoking status: Never Smoker  . Smokeless tobacco: Never Used  . Alcohol use Yes     Allergies   Patient has no known allergies.   Review of Systems Review of Systems  Constitutional:       Per HPI, otherwise negative  HENT:       Per HPI, otherwise negative  Respiratory:       Per HPI, otherwise negative  Cardiovascular:       Per HPI, otherwise negative  Gastrointestinal: Positive for abdominal pain and nausea. Negative for vomiting.  Endocrine:   Negative aside from HPI  Genitourinary:       Neg aside from HPI   Musculoskeletal:       Per HPI, otherwise negative  Skin: Negative.   Neurological: Negative for syncope.     Physical Exam Updated Vital Signs BP (!) 176/148   Pulse 78   Temp 99.1 F (37.3 C) (Oral)   Resp 16   Ht 5' (1.524 m)   Wt 55.8 kg (123 lb)   LMP 06/15/2017   SpO2 100%   BMI 24.02 kg/m   Physical Exam  Constitutional: She is oriented to person, place, and time. She appears well-developed and well-nourished. No distress.  HENT:  Head: Normocephalic and atraumatic.  Eyes: Conjunctivae and EOM are normal.  Cardiovascular: Normal rate and regular rhythm.   Pulmonary/Chest: Effort normal and breath sounds normal. No stridor. No respiratory distress.  Abdominal: She exhibits no distension.  No ttp, but patient indicates that her pain is deep in the R flank area.  Musculoskeletal: She exhibits no edema.  Neurological: She is alert and oriented to person, place, and time. No cranial nerve deficit.  Skin: Skin is warm and dry.  Psychiatric: She has a normal mood and affect.  Nursing note and vitals reviewed.    ED Treatments / Results  Labs (all labs ordered are listed, but only abnormal results are displayed) Labs  Reviewed  URINALYSIS, ROUTINE W REFLEX MICROSCOPIC - Abnormal; Notable for the following:       Result Value   APPearance CLOUDY (*)    Hgb urine dipstick LARGE (*)    Protein, ur 30 (*)    Leukocytes, UA LARGE (*)    Bacteria, UA RARE (*)    Squamous Epithelial / LPF 0-5 (*)    All other components within normal limits  POC URINE PREG, ED     Procedures Procedures (including critical care time)  Medications Ordered in ED Medications  sodium chloride 0.9 % bolus 1,000 mL (1,000 mLs Intravenous New Bag/Given 06/24/17 1335)  ketorolac (TORADOL) 30 MG/ML injection 15 mg (15 mg Intravenous Given 06/24/17 1335)   Radiology results reviewed, and I demonstrated the images to the  patient.   Initial Impression / Assessment and Plan / ED Course  I have reviewed the triage vital signs and the nursing notes.  Pertinent labs & imaging results that were available during my care of the patient were reviewed by me and considered in my medical decision making (see chart for details).  On repeat exam patient appears comfortable, remains hemodynamically unremarkable.  This well-appearing female presents with new right flank pain. Patient is awake, alert, with a soft, non-peritoneal abdomen. Patient is afebrile, and results are not consistent with infection, kidney stone. Initial studies dated demonstrate hematuria, and with some suspicion of kidney stone given the persistency of flank pain CT scan was performed. This demonstrated the presence of a right-sided ovarian cyst, likely contributing to her symptoms. With no evidence for rupture, pregnancy, the patient was discharged condition with primary care follow-up  Final Clinical Impressions(s) / ED Diagnoses  Right flank pain Ovarian cyst, right-sided UTI   Gerhard Munch, MD 06/24/17 1359    Gerhard Munch, MD 06/24/17 1408

## 2017-06-24 NOTE — Discharge Instructions (Signed)
As discussed, your evaluation today has been largely reassuring.  But, it is important that you monitor your condition carefully, and do not hesitate to return to the ED if you develop new, or concerning changes in your condition. ? ?Otherwise, please follow-up with your physician for appropriate ongoing care. ? ?

## 2017-06-27 ENCOUNTER — Telehealth: Payer: Self-pay | Admitting: Emergency Medicine

## 2017-06-27 NOTE — Telephone Encounter (Signed)
CM received call from pharmacy wanting confirmation to dispense tramadol Rx written on 06/24/2017 that did not have Dr. Priscille Loveless signature.  Confirmed through chart review that it was ok to dispense.  No further CM needs noted at this time.

## 2024-06-20 ENCOUNTER — Ambulatory Visit (HOSPITAL_COMMUNITY)
Admission: EM | Admit: 2024-06-20 | Discharge: 2024-06-20 | Disposition: A | Discharge status: Home / Self Care | Payer: No Typology Code available for payment source | Attending: Emergency Medicine | Admitting: Emergency Medicine

## 2024-06-20 ENCOUNTER — Encounter (HOSPITAL_COMMUNITY): Payer: Self-pay

## 2024-06-20 DIAGNOSIS — S91311A Laceration without foreign body, right foot, initial encounter: Secondary | ICD-10-CM | POA: Diagnosis not present

## 2024-06-20 DIAGNOSIS — Z23 Encounter for immunization: Secondary | ICD-10-CM

## 2024-06-20 MED ORDER — TETANUS-DIPHTH-ACELL PERTUSSIS 5-2.5-18.5 LF-MCG/0.5 IM SUSY
PREFILLED_SYRINGE | INTRAMUSCULAR | Status: AC
  Filled 2024-06-20: qty 0.5

## 2024-06-20 MED ORDER — TETANUS-DIPHTH-ACELL PERTUSSIS 5-2.5-18.5 LF-MCG/0.5 IM SUSY
0.5000 mL | PREFILLED_SYRINGE | Freq: Once | INTRAMUSCULAR | Status: AC
  Administered 2024-06-20: 0.5 mL via INTRAMUSCULAR

## 2024-06-20 MED ORDER — LIDOCAINE-EPINEPHRINE 1 %-1:100000 IJ SOLN
INTRAMUSCULAR | Status: AC
  Filled 2024-06-20: qty 1

## 2024-06-20 MED ORDER — MUPIROCIN 2 % EX OINT: 1.0000 | TOPICAL_OINTMENT | Freq: Two times a day (BID) | CUTANEOUS | 0 refills | Status: AC

## 2024-06-20 NOTE — ED Triage Notes (Signed)
 Patient states she was on an escalator and had a cart with her. Patient states her thumb got caught and was unable to move her right foot off of the escalator and was cut  above the medial right ankle.   Patient is not sure when she had a Tetanus  last.

## 2024-06-20 NOTE — ED Provider Notes (Signed)
 [facility]    CSN: [id number] Arrival date & time: 06/20/24  1646      History   Chief Complaint Chief Complaint  Patient presents with   Extremity Laceration    HPI Connie Hale is a 38 y.o. female.   Patient presents with laceration to her right ankle that occurred today on an escalator.  Patient states that she was riding in a children's car on the escalator when her foot got caught and was cut on the escalator.  Bleeding is controlled at this time.  Patient is unsure of her last Tdap.  Patient denies any other injuries from this incident.  The history is provided by the patient and medical records.    History reviewed. No pertinent past medical history.  There are no active problems to display for this patient.   History reviewed. No pertinent surgical history.  OB History   No obstetric history on file.      Home Medications    Prior to Admission medications   Medication Sig Start Date End Date Taking? Authorizing Provider  mupirocin  ointment (BACTROBAN ) 2 % Apply 1 Application topically 2 (two) times daily. 06/20/24  Yes Johnie Flaming A, NP  Norgestim-Eth Estrad Triphasic (ORTHO TRI-CYCLEN, 28, PO) Take 1 tablet by mouth daily. Patient not taking: Reported on 06/20/2024    [provider]  traMADol  (ULTRAM ) 50 MG tablet Take 1 tablet (50 mg total) by mouth every 6 (six) hours as needed. Patient not taking: Reported on 06/20/2024 06/24/17   Garrick Charleston, MD  Witch Hazel (PREPARATION H EX) Apply 1 application topically daily as needed (pain). Patient not taking: Reported on 06/20/2024    [provider]    Family History History reviewed. No pertinent family history.  Social History Social History   Tobacco Use   Smoking status: Never   Smokeless tobacco: Never  Vaping Use   Vaping status: Some Days   Substances: THC  Substance Use Topics   Alcohol use: Yes   Drug use: Yes    Types: Marijuana     Allergies    Patient has no known allergies.   Review of Systems Review of Systems  Per HPI  Physical Exam Triage Vital Signs ED Triage Vitals  Encounter Vitals Group     BP 06/20/24 1709 120/76     Girls Systolic BP Percentile --      Girls Diastolic BP Percentile --      Boys Systolic BP Percentile --      Boys Diastolic BP Percentile --      Pulse Rate 06/20/24 1709 77     Resp 06/20/24 1709 16     Temp 06/20/24 1709 99.3 F (37.4 C)     Temp Source 06/20/24 1709 Oral     SpO2 06/20/24 1709 100 %     Weight --      Height --      Head Circumference --      Peak Flow --      Pain Score 06/20/24 1708 7     Pain Loc --      Pain Education --      Exclude from Growth Chart --    No data found.  Updated Vital Signs BP 120/76 (BP Location: Right Arm)   Pulse 77   Temp 99.3 F (37.4 C) (Oral)   Resp 16   LMP 05/13/2024 (Exact Date)   SpO2 100%   Visual Acuity Right Eye Distance:  Left Eye Distance:   Bilateral Distance:    Right Eye Near:   Left Eye Near:    Bilateral Near:     Physical Exam Constitutional:      General: She is awake. She is not in acute distress.    Appearance: Normal appearance. She is well-developed and well-groomed. She is not ill-appearing.  Skin:    General: Skin is warm and dry.     Findings: Laceration present.     Comments: Approximately 2-1/2 cm laceration noted just below the medial ankle.  Neurological:     Mental Status: She is alert.  Psychiatric:        Behavior: Behavior is cooperative.      UC Treatments / Results  Labs (all labs ordered are listed, but only abnormal results are displayed) Labs Reviewed - No data to display  EKG   Radiology No results found.  Procedures Laceration Repair  Date/Time: 06/20/2024 6:28 PM  Performed by: Johnie Rumaldo LABOR, NP Authorized by: Johnie Rumaldo LABOR, NP   Consent:    Consent obtained:  Verbal   Consent given by:  Patient   Risks, benefits, and alternatives were  discussed: yes     Risks discussed:  Infection, need for additional repair, pain, poor cosmetic result and poor wound healing   Alternatives discussed:  No treatment and delayed treatment Universal protocol:    Procedure explained and questions answered to patient or proxy's satisfaction: yes     Relevant documents present and verified: yes     Patient identity confirmed:  Verbally with patient Anesthesia:    Anesthesia method:  Local infiltration   Local anesthetic:  Lidocaine  2% WITH epi Laceration details:    Location:  Foot   Foot location:  R ankle   Length (cm):  2.5 Exploration:    Hemostasis achieved with:  Direct pressure   Wound exploration: wound explored through full range of motion and entire depth of wound visualized     Contaminated: no   Treatment:    Area cleansed with:  Povidone-iodine and Shur-Clens   Amount of cleaning:  Standard Skin repair:    Repair method:  Sutures   Suture size:  4-0   Suture material:  Prolene   Suture technique:  Simple interrupted   Number of sutures:  4 Approximation:    Approximation:  Close Repair type:    Repair type:  Simple Post-procedure details:    Dressing:  Bulky dressing   Procedure completion:  Tolerated well, no immediate complications  (including critical care time)  Medications Ordered in UC Medications  Tdap (BOOSTRIX ) injection 0.5 mL (0.5 mLs Intramuscular Given 06/20/24 1759)    Initial Impression / Assessment and Plan / UC Course  I have reviewed the triage vital signs and the nursing notes.  Pertinent labs & imaging results that were available during my care of the patient were reviewed by me and considered in my medical decision making (see chart for details).     Patient is overall well-appearing.  Vitals are stable.  Laceration repair performed as documented above.  Patient tolerated this well with no immediate complications.  Updated Tdap in clinic.  Prescribing mupirocin  ointment for infection  prevention.  Discussed proper wound care.  Discussed follow-up and return precautions. Final Clinical Impressions(s) / UC Diagnoses   Final diagnoses:  Laceration of right foot, initial encounter     Discharge Instructions      Do not get the area wet for the 24 hours. After  this you can gently clean with mild soap and water. Apply mupirocin  ointment twice daily for infection prevention. Keep the area clean dry and covered. Return in 10 to 14 days for suture removal. Return sooner if you notice swelling, spreading of redness, or puslike drainage from the area as this would indicate infection.   ED Prescriptions     Medication Sig Dispense Auth. Provider   mupirocin  ointment (BACTROBAN ) 2 % Apply 1 Application topically 2 (two) times daily. 22 g Johnie Flaming A, NP      PDMP not reviewed this encounter.   Johnie Flaming A, NP 06/20/24 [phone]

## 2024-06-20 NOTE — Discharge Instructions (Addendum)
 Do not get the area wet for the 24 hours. After this you can gently clean with mild soap and water. Apply mupirocin  ointment twice daily for infection prevention. Keep the area clean dry and covered. Return in 10 to 14 days for suture removal. Return sooner if you notice swelling, spreading of redness, or puslike drainage from the area as this would indicate infection.

## 2024-06-21 ENCOUNTER — Telehealth: Payer: Self-pay

## 2024-06-21 MED ORDER — NAPROXEN 500 MG PO TABS: 500.0000 mg | ORAL_TABLET | Freq: Two times a day (BID) | ORAL | 0 refills | Status: AC

## 2024-06-21 NOTE — Telephone Encounter (Signed)
 Patient called and is having significant pain from her laceration.  Can use naproxen  for pain and inflammation.  Prescription sent to her pharmacy.

## 2024-06-21 NOTE — Telephone Encounter (Signed)
 Naproxen  sent to [facility]
